# Patient Record
Sex: Male | Born: 2005
Health system: Southern US, Community
[De-identification: ages and names within clinical notes are randomized; demographics above are authoritative.]

## PROBLEM LIST (undated history)

## (undated) DIAGNOSIS — F909 Attention-deficit hyperactivity disorder, unspecified type: Secondary | ICD-10-CM

## (undated) HISTORY — DX: Attention-deficit hyperactivity disorder, unspecified type: F90.9

---
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
## 2023-05-02 NOTE — Progress Notes (Unsigned)
   Established Patient Office Visit   Subjective  Patient ID: Nicholas Perez, male    DOB: 2005-10-19  Age: 18 y.o. MRN: [id number]  No chief complaint on file.   He  has a past medical history of ADHD (attention deficit hyperactivity disorder).  HPI  ROS    Objective:     There were no vitals taken for this visit. {Vitals History (Optional):23777}  Physical Exam   No results found for any visits on 05/03/23.  The ASCVD Risk score (Arnett DK, et al., 2019) failed to calculate for the following reasons:   The 2019 ASCVD risk score is only valid for ages 11 to 55    Assessment & Plan:  There are no diagnoses linked to this encounter.  No follow-ups on file.   Cruzita Lederer Newman Nip, FNP

## 2023-05-02 NOTE — Patient Instructions (Signed)

## 2023-05-03 ENCOUNTER — Encounter: Payer: 59 | Admitting: Family Medicine

## 2023-05-03 NOTE — Progress Notes (Signed)
 NO SHOW

## 2023-05-15 ENCOUNTER — Encounter: Payer: Self-pay | Admitting: Family Medicine
